# Patient Record
Sex: Female | Born: 2000 | Race: White | Hispanic: No | Marital: Single | Smoking: Never smoker
Health system: Southern US, Community
[De-identification: ages and names within clinical notes are randomized; demographics above are authoritative.]

---
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
## 2023-03-20 ENCOUNTER — Other Ambulatory Visit: Payer: Self-pay

## 2023-03-20 ENCOUNTER — Encounter (HOSPITAL_COMMUNITY): Payer: Self-pay | Admitting: *Deleted

## 2023-03-20 ENCOUNTER — Emergency Department (HOSPITAL_COMMUNITY)
Admission: EM | Admit: 2023-03-20 | Discharge: 2023-03-20 | Disposition: A | Discharge status: Home / Self Care | Payer: Managed Care, Other (non HMO) | Attending: Emergency Medicine | Admitting: Emergency Medicine

## 2023-03-20 ENCOUNTER — Emergency Department (HOSPITAL_COMMUNITY): Payer: Managed Care, Other (non HMO)

## 2023-03-20 DIAGNOSIS — M545 Low back pain, unspecified: Secondary | ICD-10-CM | POA: Diagnosis present

## 2023-03-20 DIAGNOSIS — N10 Acute tubulo-interstitial nephritis: Secondary | ICD-10-CM | POA: Diagnosis not present

## 2023-03-20 DIAGNOSIS — D72829 Elevated white blood cell count, unspecified: Secondary | ICD-10-CM | POA: Diagnosis not present

## 2023-03-20 DIAGNOSIS — N136 Pyonephrosis: Secondary | ICD-10-CM

## 2023-03-20 LAB — URINALYSIS, ROUTINE W REFLEX MICROSCOPIC
Bacteria, UA: NONE SEEN
Bilirubin Urine: NEGATIVE
Glucose, UA: NEGATIVE mg/dL
Ketones, ur: 20 mg/dL — AB
Nitrite: NEGATIVE
Protein, ur: 100 mg/dL — AB
RBC / HPF: >50 RBC/hpf (ref 0–5)
Specific Gravity, Urine: 1.014 (ref 1.005–1.030)
WBC, UA: >50 WBC/hpf (ref 0–5)
pH: 5 (ref 5.0–8.0)

## 2023-03-20 LAB — WET PREP, GENITAL
Clue Cells Wet Prep HPF POC: NONE SEEN
Sperm: NONE SEEN
Trich, Wet Prep: NONE SEEN
WBC, Wet Prep HPF POC: <10 (ref <10)
Yeast Wet Prep HPF POC: NONE SEEN

## 2023-03-20 LAB — BASIC METABOLIC PANEL
Anion gap: 10 (ref 5–15)
BUN: 11 mg/dL (ref 6–20)
CO2: 19 mmol/L — ABNORMAL LOW (ref 22–32)
Calcium: 8.8 mg/dL — ABNORMAL LOW (ref 8.9–10.3)
Chloride: 107 mmol/L (ref 98–111)
Creatinine, Ser: 0.68 mg/dL (ref 0.44–1.00)
GFR, Estimated: >60 mL/min (ref >60)
Glucose, Bld: 109 mg/dL — ABNORMAL HIGH (ref 70–99)
Potassium: 3.6 mmol/L (ref 3.5–5.1)
Sodium: 136 mmol/L (ref 135–145)

## 2023-03-20 LAB — CBC
HCT: 38.1 % (ref 36.0–46.0)
Hemoglobin: 12.2 g/dL (ref 12.0–15.0)
MCH: 29.8 pg (ref 26.0–34.0)
MCHC: 32 g/dL (ref 30.0–36.0)
MCV: 92.9 fL (ref 80.0–100.0)
Platelets: 261 10*3/uL (ref 150–400)
RBC: 4.1 MIL/uL (ref 3.87–5.11)
RDW: 14.6 % (ref 11.5–15.5)
WBC: 13.6 10*3/uL — ABNORMAL HIGH (ref 4.0–10.5)
nRBC: 0 % (ref 0.0–0.2)

## 2023-03-20 LAB — PREGNANCY, URINE: Preg Test, Ur: NEGATIVE

## 2023-03-20 LAB — I-STAT BETA HCG BLOOD, ED (MC, WL, AP ONLY): I-stat hCG, quantitative: 8.5 m[IU]/mL — ABNORMAL HIGH (ref <5)

## 2023-03-20 MED ORDER — LACTATED RINGERS IV BOLUS
1000.0000 mL | Freq: Once | INTRAVENOUS | Status: AC
  Administered 2023-03-20: 1000 mL via INTRAVENOUS

## 2023-03-20 MED ORDER — IOHEXOL 350 MG/ML SOLN
75.0000 mL | Freq: Once | INTRAVENOUS | Status: AC | PRN
  Administered 2023-03-20: 75 mL via INTRAVENOUS

## 2023-03-20 MED ORDER — ACETAMINOPHEN 500 MG PO TABS
1000.0000 mg | ORAL_TABLET | ORAL | Status: AC
  Administered 2023-03-20: 1000 mg via ORAL
  Filled 2023-03-20: qty 2

## 2023-03-20 MED ORDER — CIPROFLOXACIN HCL 500 MG PO TABS: 500.0000 mg | ORAL_TABLET | Freq: Two times a day (BID) | ORAL | 0 refills | Status: AC

## 2023-03-20 MED ORDER — SODIUM CHLORIDE 0.9 % IV SOLN
1.0000 g | Freq: Once | INTRAVENOUS | Status: AC
  Administered 2023-03-20: 1 g via INTRAVENOUS
  Filled 2023-03-20: qty 10

## 2023-03-20 NOTE — ED Provider Notes (Signed)
Care of patient handed off to me by Pati Gallo, PA-C at change of shift.  Briefly, 22 year old otherwise healthy female who presents to the emergency department with urinary hesitancy and frequency since Sunday.  She had reportedly some hematuria last night.  Here she had mild leukocytosis to 13.6, BMP without AKI.  UA with hematuria.  UA also appears to show sterile pyuria.  She self swabbed for wet prep/GC chlamydia.  The wet prep is negative.  Pending CT given her lower abdominal/lower back pain.   Physical Exam  BP 121/79 (BP Location: Right Arm)   Pulse 82   Temp 98.2 F (36.8 C) (Oral)   Resp 16   Ht 5\' 5"  (1.651 m)   Wt 59 kg   SpO2 98%   BMI 21.63 kg/m   Physical Exam Vitals and nursing note reviewed.  HENT:     Head: Normocephalic and atraumatic.  Eyes:     General: No scleral icterus. Pulmonary:     Effort: Pulmonary effort is normal. No respiratory distress.  Skin:    Findings: No rash.  Neurological:     General: No focal deficit present.     Mental Status: She is alert.  Psychiatric:        Mood and Affect: Mood normal.        Behavior: Behavior normal.        Thought Content: Thought content normal.        Judgment: Judgment normal.     Procedures  Procedures  ED Course / MDM   Clinical Course as of 03/20/23 0835  Thu Mar 20, 2023  0823 Spoke with Dr. Milford Cage, urology, he recommends treat with cipro or levaquin for 10 days and outpatient follow-up. Low threshold if she runs fever etc to return  [LA]    Clinical Course User Index [LA] Mickie Hillier, PA-C   Medical Decision Making Problems Addressed: Acute pyonephrosis: complicated acute illness or injury  Amount and/or Complexity of Data Reviewed External Data Reviewed: labs and radiology. Labs: ordered. Decision-making details documented in ED Course. Radiology: ordered and independent interpretation performed. Decision-making details documented in ED Course.  Risk OTC drugs. Prescription  drug management.   While here receiving IV fluids, Tylenol and 1 g of Rocephin.  I added on a urine culture.  CT scan for tenderness to lower abdomen.   - 0820: CT scan showed right-sided pyonephrosis.  I consulted urology and spoke with Dr. Milford Cage who evaluated the CT scan.  He recommends that the patient be treated with either ciprofloxacin or Levaquin for 10 days.  He will see her in outpatient clinic in follow-up.  Also recommends low threshold of returning for worsening symptoms.  I discussed the findings with the patient.  Discussed that we will be placing her on ciprofloxacin for 10 days.  Discussed that she will have a referral to urology to be seen in outpatient clinic.  Additionally, we discussed strict return precautions should she have any worsening symptoms like fever, nausea and vomiting, chills/rigor, decreased urine output.  She verbalized understanding.  Otherwise feel that she is safe for discharge at this time.        Mickie Hillier, PA-C 03/20/23 [id number]    Ezequiel Essex, MD 03/20/23 [phone]

## 2023-03-20 NOTE — ED Notes (Signed)
Patient transported to CT 

## 2023-03-20 NOTE — ED Notes (Signed)
Pt agreed with discharge, instructions, prescription and referrals were reviewed. Verbalized understanding. Denies pain at the moment. Pt ambulatory, family bedside

## 2023-03-20 NOTE — ED Triage Notes (Signed)
Patient reports she thought she may have had a uti since Monday.  Tonight she developed onset of hematuria and worse pain, esp on the right.  Patient did not take any meds prior to arrival.

## 2023-03-20 NOTE — ED Provider Notes (Signed)
[facility] Provider Note   CSN: [id number] Arrival date & time: 03/20/23  0206     History  Chief Complaint  Patient presents with   Flank Pain   Hematuria    Janet Finley is a 22 y.o. female.   Flank Pain  Hematuria  Patient is a 22 year old female with no pertinent past medical history no history of abdominal surgeries  She is present emergency room today with complaints of low back pain and noticed blood in her urine.  She states that for the past 4 days she has noticed sometimes having some hesitancy when urinating and she also feels like she may be peeing somewhat more often however she denies any dysuria.  She states that she does not have frequent UTIs and cannot recall the last time she had a urinary tract infection.  No fevers at home.  She does say that she has some achy right-sided low back pain no nausea or vomiting.  She denies any pelvic pain vaginal discharge, vaginal bleeding.  She states she is monogamous with a single female partner.  Intermittently uses barrier protection.  She states her partner has not had any penile discharge.       Home Medications Prior to Admission medications   Not on File      Allergies    Patient has no known allergies.    Review of Systems   Review of Systems  Genitourinary:  Positive for flank pain and hematuria.    Physical Exam Updated Vital Signs BP 121/79 (BP Location: Right Arm)   Pulse 82   Temp 98.2 F (36.8 C) (Oral)   Resp 16   Ht 5\' 5"  (1.651 m)   Wt 59 kg   SpO2 98%   BMI 21.63 kg/m  Physical Exam Vitals and nursing note reviewed.  Constitutional:      General: She is not in acute distress.    Comments: Pleasant 22 year old female in no acute distress.  HENT:     Head: Normocephalic and atraumatic.     Nose: Nose normal.     Mouth/Throat:     Mouth: Mucous membranes are dry.  Eyes:     General: No scleral icterus. Cardiovascular:     Rate and  Rhythm: Normal rate and regular rhythm.     Pulses: Normal pulses.     Heart sounds: Normal heart sounds.  Pulmonary:     Effort: Pulmonary effort is normal. No respiratory distress.     Breath sounds: No wheezing.  Abdominal:     Palpations: Abdomen is soft.     Tenderness: There is abdominal tenderness.     Comments: Mild abdominal tenderness that is diffuse nonfocal no guarding or rebound.  Abdomen is soft and flat.  Musculoskeletal:     Cervical back: Normal range of motion.     Right lower leg: No edema.     Left lower leg: No edema.  Skin:    General: Skin is warm and dry.     Capillary Refill: Capillary refill takes less than 2 seconds.  Neurological:     Mental Status: She is alert. Mental status is at baseline.  Psychiatric:        Mood and Affect: Mood normal.        Behavior: Behavior normal.     ED Results / Procedures / Treatments   Labs (all labs ordered are listed, but only abnormal results are displayed) Labs Reviewed  URINALYSIS, ROUTINE  W REFLEX MICROSCOPIC - Abnormal; Notable for the following components:      Result Value   Color, Urine AMBER (*)    APPearance CLOUDY (*)    Hgb urine dipstick LARGE (*)    Ketones, ur 20 (*)    Protein, ur 100 (*)    Leukocytes,Ua LARGE (*)    Non Squamous Epithelial 0-5 (*)    All other components within normal limits  BASIC METABOLIC PANEL - Abnormal; Notable for the following components:   CO2 19 (*)    Glucose, Bld 109 (*)    Calcium 8.8 (*)    All other components within normal limits  CBC - Abnormal; Notable for the following components:   WBC 13.6 (*)    All other components within normal limits  I-STAT BETA HCG BLOOD, ED ([facility], [facility], [facility] ONLY) - Abnormal; Notable for the following components:   I-stat hCG, quantitative 8.5 (*)    All other components within normal limits  WET PREP, GENITAL  URINE CULTURE  PREGNANCY, URINE  GC/CHLAMYDIA PROBE AMP ([facility]) NOT AT [facility]    EKG None  Radiology No  results found.  Procedures Procedures    Medications Ordered in ED Medications  acetaminophen (TYLENOL) tablet 1,000 mg (1,000 mg Oral Given 03/20/23 0543)  lactated ringers bolus 1,000 mL (1,000 mLs Intravenous New Bag/Given 03/20/23 0626)  cefTRIAXone (ROCEPHIN) 1 g in sodium chloride 0.9 % 100 mL IVPB (0 g Intravenous Stopped 03/20/23 0612)    ED Course/ Medical Decision Making/ A&P                             Medical Decision Making Amount and/or Complexity of Data Reviewed Labs: ordered. Radiology: ordered.  Risk OTC drugs.   This patient presents to the ED for concern of back pain, hematuria, this involves a number of treatment options, and is a complaint that carries with it a moderate risk of complications and morbidity. A differential diagnosis was considered for the patient's symptoms which is discussed below:   Pyelonephritis, kidney stone   Co morbidities: Discussed in HPI   Brief History:  Patient is a 22 year old female with no pertinent past medical history no history of abdominal surgeries  She is present emergency room today with complaints of low back pain and noticed blood in her urine.  She states that for the past 4 days she has noticed sometimes having some hesitancy when urinating and she also feels like she may be peeing somewhat more often however she denies any dysuria.  She states that she does not have frequent UTIs and cannot recall the last time she had a urinary tract infection.  No fevers at home.  She does say that she has some achy right-sided low back pain no nausea or vomiting.  She denies any pelvic pain vaginal discharge, vaginal bleeding.  She states she is monogamous with a single female partner.  Intermittently uses barrier protection.  She states her partner has not had any penile discharge.    EMR reviewed including pt PMHx, past surgical history and past visits to ER.   See HPI for more details   Lab Tests:   I ordered and  independently interpreted labs. Labs notable for WET - neg Preg - neg CBC leukocytosis  BMP unremarkable UA w leuks, WBCs, consistent with urinary tract infection.   Imaging Studies:  CT abdomen pelvis with contrast ordered pending at time of shift change  Cardiac Monitoring:      Medicines ordered:  I ordered medication including Rocephin, Tylenol, LR for pain and hydration Reevaluation of the patient after these medicines showed that the patient improved I have reviewed the patients home medicines and have made adjustments as needed   Critical Interventions:     Consults/Attending Physician      Reevaluation:  After the interventions noted above I re-evaluated patient and found that they have :improved   Social Determinants of Health:      Problem List / ED Course:  Likely pyelonephritis.    Dispostion:  7:06 AM Care of @PATIENTNAME @ transferred to PA LA and Dr. Wyvonnia Dusky at the end of my shift as the patient will require reassessment once labs/imaging have resulted. Patient presentation, ED course, and plan of care discussed with review of all pertinent labs and imaging. Please see his/her note for further details regarding further ED course and disposition. Plan at time of handoff is re-eval after CT. This may be altered or completely changed at the discretion of the oncoming team pending results of further workup.    Final Clinical Impression(s) / ED Diagnoses Final diagnoses:  None    Rx / DC Orders ED Discharge Orders     None         Tedd Sias, [place] 03/20/23 [id number]    Merryl Hacker, MD 03/25/23 [phone]

## 2023-03-20 NOTE — Discharge Instructions (Addendum)
You were seen in the emergency department today for having blood in your urine and low back pain.  You do have evidence of infection within your bladder and up to your right kidney.  We are placing you on ciprofloxacin which is an antibiotic that you will take twice a day over the next 10 days.  We will also have you follow-up with Dr. Milford Cage, a urologist in the outpatient setting.  I have placed this information in your discharge paperwork.  If you do not receive a call in about 48-72 business hours please call his office to schedule follow-up appointment.  Additionally, we are continue strict return precautions to the emergency department should she have any worsening symptoms like fever, nausea and vomiting, generally feeling worse, decreased urine output.

## 2023-03-21 LAB — URINE CULTURE: Culture: <10000 — AB

## 2023-03-21 LAB — GC/CHLAMYDIA PROBE AMP (~~LOC~~) NOT AT ARMC
Chlamydia: NEGATIVE
Comment: NEGATIVE
Comment: NORMAL
Neisseria Gonorrhea: NEGATIVE
